# Patient Record
Sex: Male | Born: 1955 | Race: Black or African American | Hispanic: No | Smoking: Current every day smoker
Health system: Southern US, Community
[De-identification: ages and names within clinical notes are randomized; demographics above are authoritative.]

## PROBLEM LIST (undated history)

## (undated) DIAGNOSIS — E119 Type 2 diabetes mellitus without complications: Secondary | ICD-10-CM

## (undated) DIAGNOSIS — I10 Essential (primary) hypertension: Secondary | ICD-10-CM

---

## 1998-01-05 ENCOUNTER — Ambulatory Visit (HOSPITAL_COMMUNITY): Admission: RE | Admit: 1998-01-05 | Discharge: 1998-01-05 | Payer: Self-pay | Admitting: Family Medicine

## 2000-08-22 ENCOUNTER — Encounter: Payer: Self-pay | Admitting: Emergency Medicine

## 2000-08-22 ENCOUNTER — Inpatient Hospital Stay (HOSPITAL_COMMUNITY): Admission: EM | Admit: 2000-08-22 | Discharge: 2000-08-23 | Payer: Self-pay | Admitting: Internal Medicine

## 2001-04-23 ENCOUNTER — Emergency Department (HOSPITAL_COMMUNITY): Admission: EM | Admit: 2001-04-23 | Discharge: 2001-04-23 | Payer: Self-pay | Admitting: Emergency Medicine

## 2002-06-13 ENCOUNTER — Emergency Department (HOSPITAL_COMMUNITY): Admission: EM | Admit: 2002-06-13 | Discharge: 2002-06-14 | Payer: Self-pay | Admitting: Emergency Medicine

## 2004-04-02 ENCOUNTER — Emergency Department (HOSPITAL_COMMUNITY): Admission: EM | Admit: 2004-04-02 | Discharge: 2004-04-02 | Payer: Self-pay | Admitting: Emergency Medicine

## 2004-12-12 IMAGING — CR DG ELBOW COMPLETE 3+V*L*
3 series · 3 of 3 positions shown · non-contrast
Comparison: none

CLINICAL DATA: Left elbow pain. 
 LEFT ELBOW COMPLETE ([DATE] HOURS)

[view not recorded (1 of 3)]
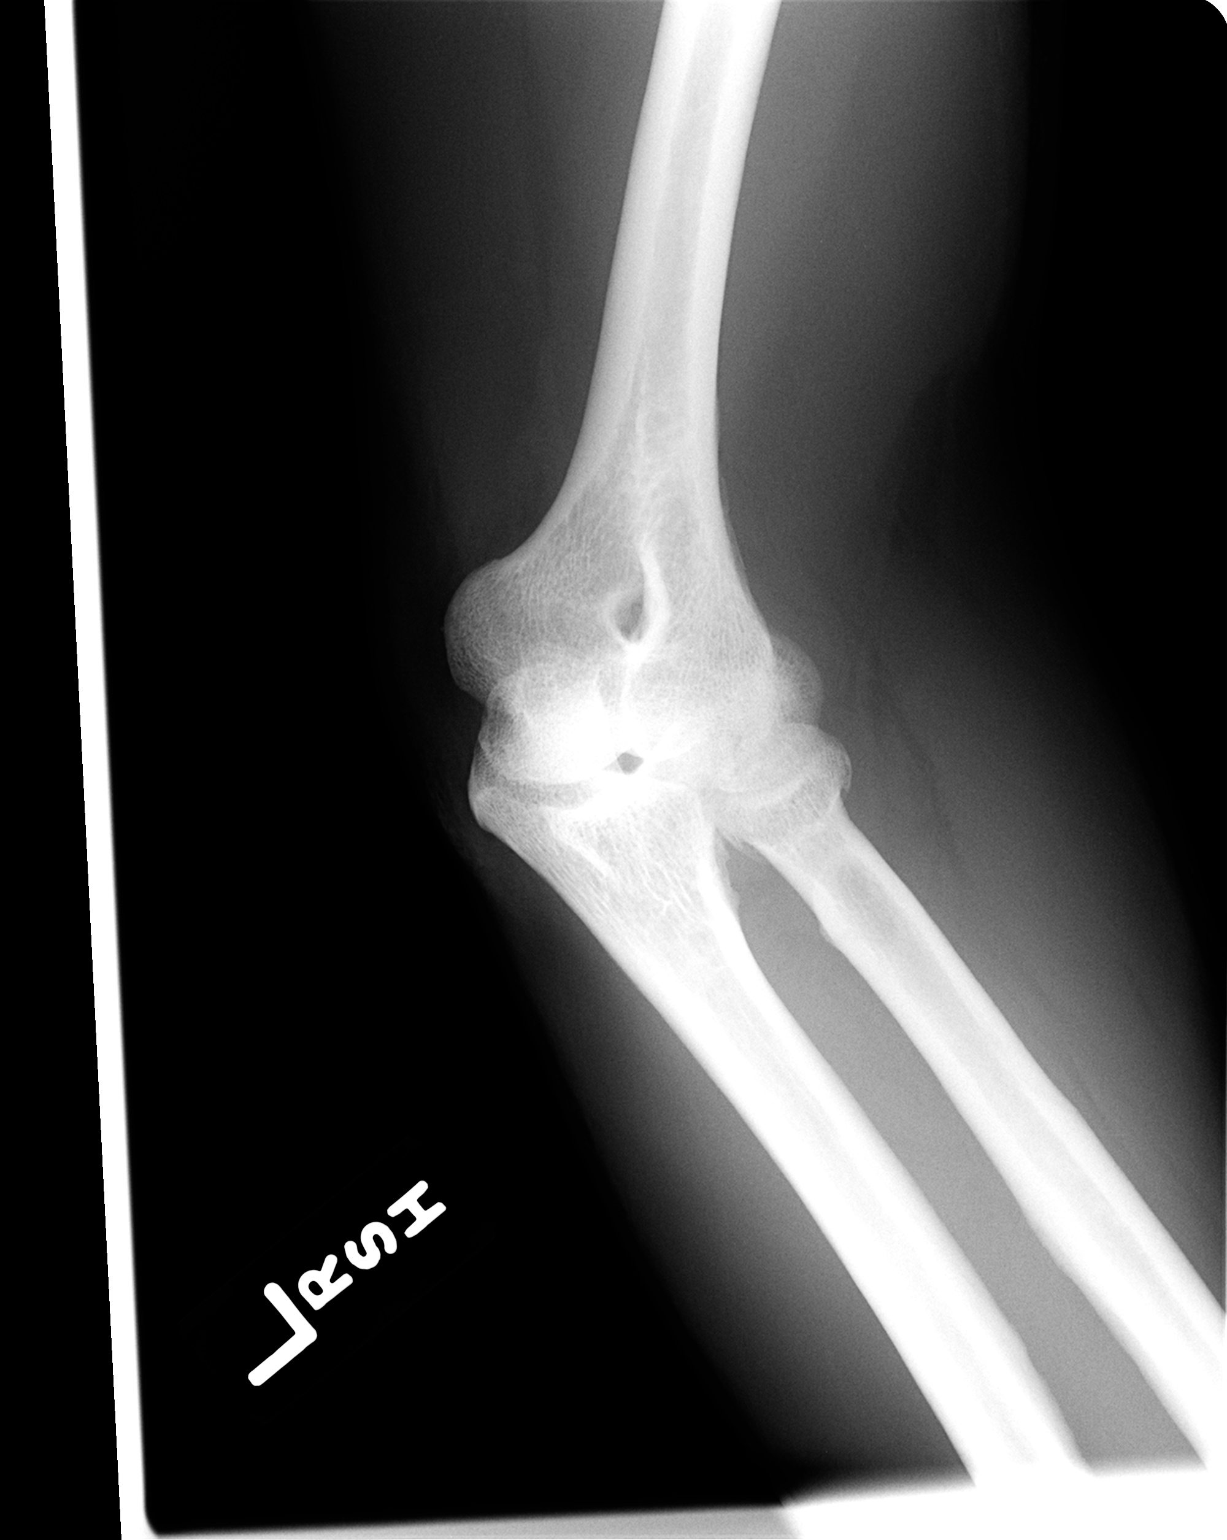

[view not recorded (2 of 3)]
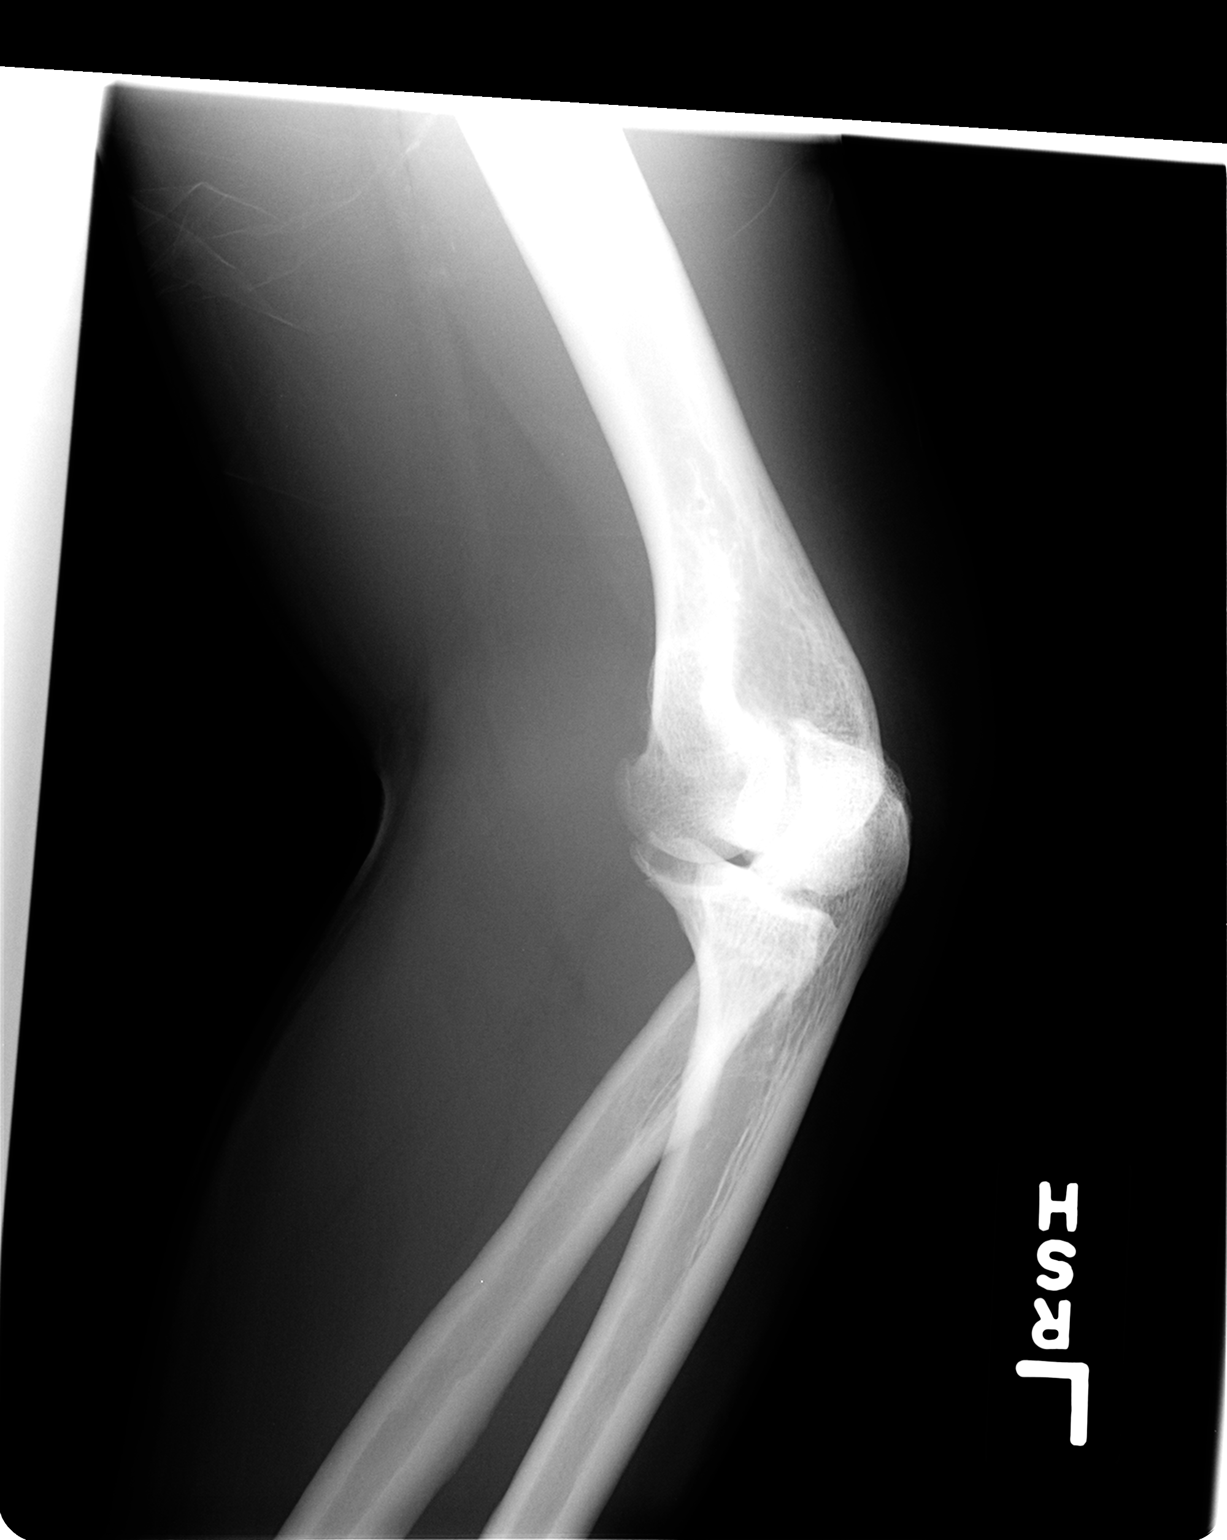

[view not recorded (3 of 3)]
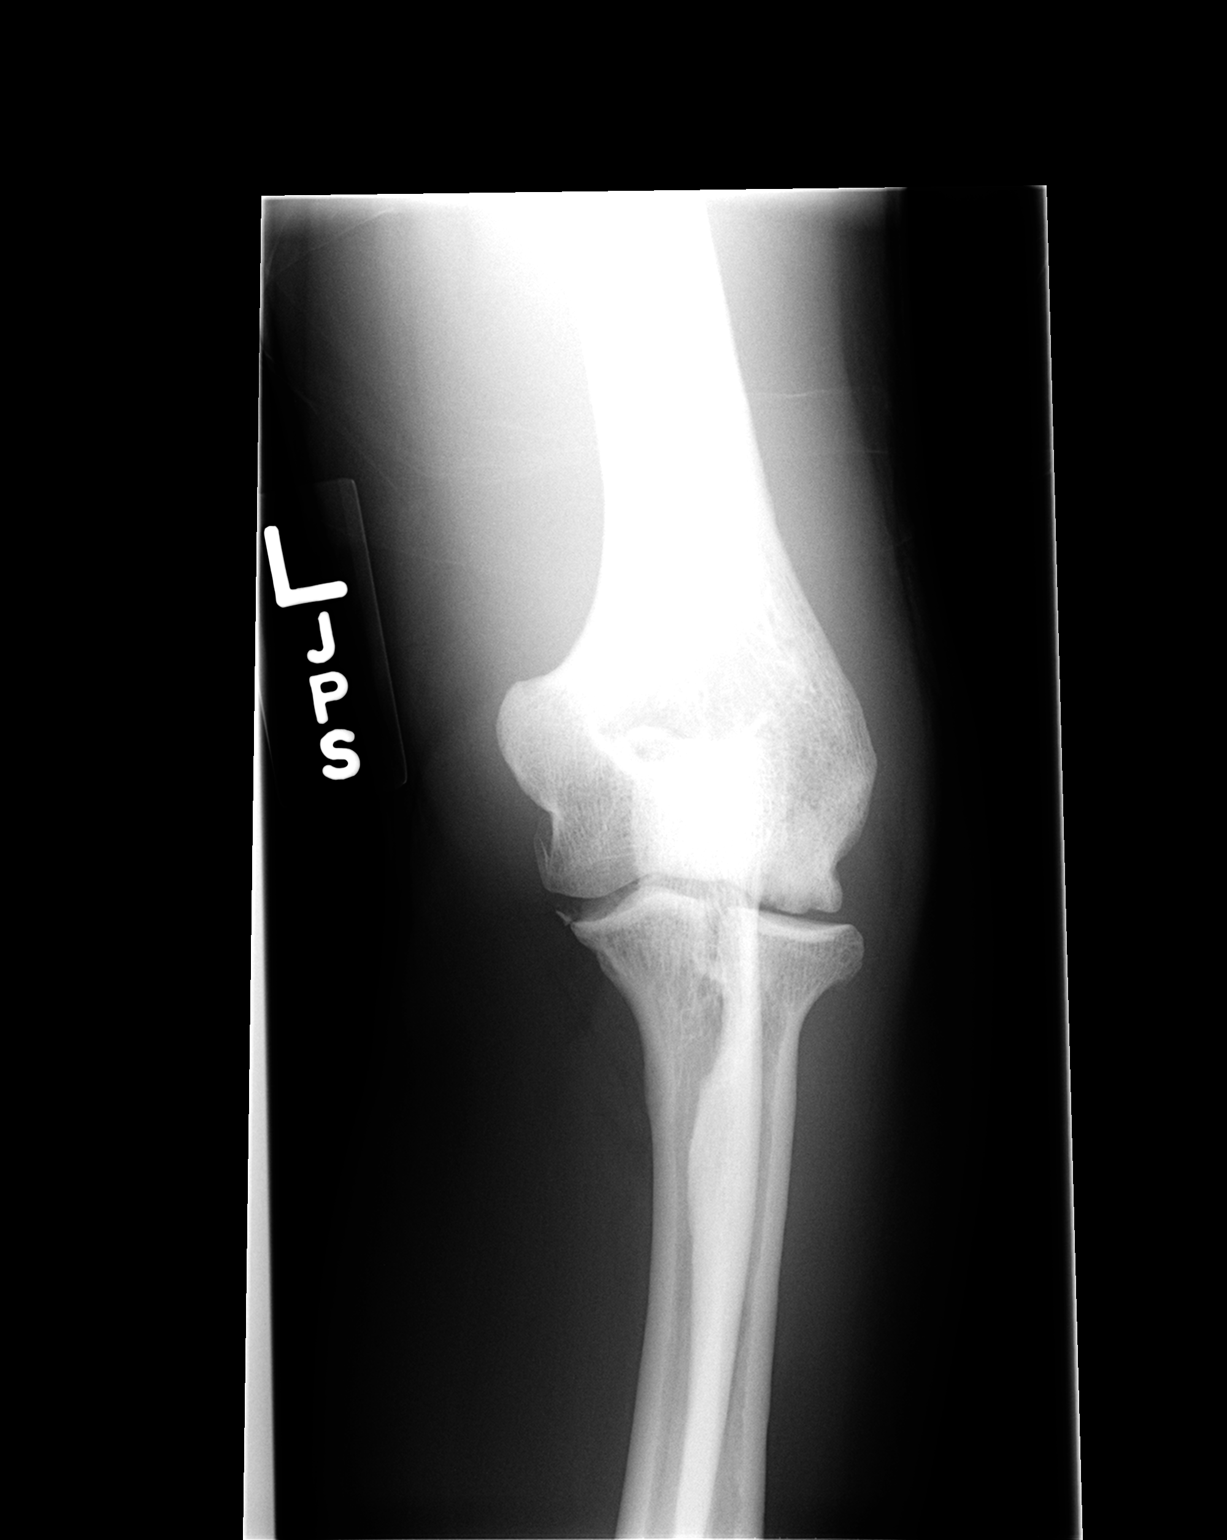

[3 of 3 positions shown; findings below may reference images not displayed]

FINDINGS: Osteophyte formation is noted about the proximal ulna and radial head.  Spurring is noted at the olecranon at the insertion of the triceps tendon.  A tiny bony density is seen on the AP view adjacent to the proximal ulna articular surface on the ulnar aspect and a tiny avulsion fracture may be present.  The capitellum articular surface is somewhat irregular and an osteochondral abnormality is not excluded.  A joint effusion is present characterized by prominence of the posterior fat pad.  On the lateral view, a 1 cm round bony density is seen anterior to the distal humerus.  It is not clearly visualized on the other views. 
 IMPRESSION
 1.  Joint effusion. 
 2.  Degenerative change. 
 3.  Tiny avulsion from the proximal ulna is suggested of indeterminate age. 
 4.  Round calcific density anterior to the distal humerus.  This may represent a bony fragment within the joint or possibly synovial osteochondromatosis. 
 5.  The articular surface of the capitellum is irregular and an osteochondral abnormality may be present.  further characterization with MRI can be performed as clinically indicated.

## 2009-03-21 ENCOUNTER — Emergency Department (HOSPITAL_COMMUNITY): Admission: EM | Admit: 2009-03-21 | Discharge: 2009-03-22 | Payer: Self-pay | Admitting: Emergency Medicine

## 2010-12-22 LAB — DIFFERENTIAL
Eosinophils Absolute: 0.1 10*3/uL (ref 0.0–0.7)
Eosinophils Relative: 1 % (ref 0–5)
Lymphocytes Relative: 34 % (ref 12–46)
Lymphs Abs: 4.7 10*3/uL — ABNORMAL HIGH (ref 0.7–4.0)
Monocytes Absolute: 1.3 10*3/uL — ABNORMAL HIGH (ref 0.1–1.0)
Monocytes Relative: 9 % (ref 3–12)

## 2010-12-22 LAB — BASIC METABOLIC PANEL
BUN: 15 mg/dL (ref 6–23)
Chloride: 108 mEq/L (ref 96–112)
GFR calc non Af Amer: >60 mL/min (ref >60)
Potassium: 3.8 mEq/L (ref 3.5–5.1)
Sodium: 138 mEq/L (ref 135–145)

## 2010-12-22 LAB — CBC
HCT: 50.5 % (ref 39.0–52.0)
Hemoglobin: 17.1 g/dL — ABNORMAL HIGH (ref 13.0–17.0)
MCV: 93.3 fL (ref 78.0–100.0)
Platelets: 210 10*3/uL (ref 150–400)
WBC: 13.6 10*3/uL — ABNORMAL HIGH (ref 4.0–10.5)

## 2010-12-22 LAB — GLUCOSE, CAPILLARY
Glucose-Capillary: 145 mg/dL — ABNORMAL HIGH (ref 70–99)
Glucose-Capillary: 227 mg/dL — ABNORMAL HIGH (ref 70–99)

## 2010-12-22 LAB — RAPID URINE DRUG SCREEN, HOSP PERFORMED
Cocaine: POSITIVE — AB
Opiates: NOT DETECTED

## 2010-12-22 LAB — ETHANOL: Alcohol, Ethyl (B): <5 mg/dL (ref 0–10)

## 2011-01-31 NOTE — Consult Note (Signed)
[place]  Patient:    Joel Hamilton, Joel Hamilton                          MRN: [id number] Proc. Date: 08/22/00 Adm. Date:  [id number] Attending:  Talmage Coin CC:         Dineen Kid. Reche Dixon, M.D.   Consultation Report  HISTORY OF PRESENT ILLNESS:  Mr. Fludd is a 55 year old black male who was admitted after having several hours of chest pain today.  The patient states that he has had a recent drinking binge and also smoking crack cocaine.  He has been lying in bed most of the day and noted some discomfort in his left pectoral area which he related to as gas.  He presented to the emergency room and was admitted for further evaluation.  The patient denies any history of IV drug use or any other drug use besides crack cocaine.  He does smoke cigarettes, approximately one pack-per-day.  He does Holiday representative work and does note similar left precordial vein when he is lifting something very heavy or pushing a heavy wheelbarrow.  In his usual every day activity has not noticed any chest pain.  He does not really know how long this has been going on.  He has a difficult time relating how long he has been using cocaine.  He as no other associated symptoms of nausea, vomiting, diaphoresis, or shortness of breath.  The patient apparently has a history of hypertension but has been on no medications.  He has no known history of diabetes, hypercholesterolemia, or family history of coronary disease.  PAST MEDICAL HISTORY:  Significant for depression and hypertension.  MEDICATIONS:  He is on no medications at present and has no known allergies.  PHYSICAL EXAMINATION:  GENERAL:  The patient is a young black male in no acute distress.  VITAL SIGNS:  Blood pressure is 160/90, pulse is 100, respirations 20.  He is afebrile.  HEENT:  Unremarkable except for dry mucous membranes.  He has no chest wall tenderness.  CARDIAC:  Without gallops, murmurs, rubs, or  clicks.  ABDOMEN:  Soft and nontender.  No masses or hepatosplenomegaly.  LUNGS:  Clear.  EXTREMITIES:  He has no edema.  He has no needle tracks.  ECG is normal.  Chest x-ray shows no active disease.  His CK was 879 with 7.0 MB.  Troponin was .05.  IMPRESSION: 1. Chest pain, atypical.  I suspect this is more muscular in origin.  Probably    related to his recent substance abuse and cocaine abuse. 2. Cocaine and alcohol abuse. 3. Tobacco abuse. 4. Hypertension.  PLAN:  It would make sense to get serial cardiac enzymes.  However, I think it is unlikely that this represents a coronary event.  His biggest risk is ongoing cocaine abuse, and he needs to be counseled on stopping this.  If the attending physician feels strongly, we could certainly arrange for the patient to have a stress test to further ______  him, but his biggest risk will be how he manages his substance abuse.  Will await the result of subsequent cardiac enzymes. DD:  08/22/00 TD:  08/23/00 Job: [id number]

## 2017-09-18 ENCOUNTER — Encounter: Payer: Self-pay | Admitting: Pediatric Intensive Care

## 2017-09-18 DIAGNOSIS — E118 Type 2 diabetes mellitus with unspecified complications: Secondary | ICD-10-CM

## 2017-09-18 DIAGNOSIS — Z794 Long term (current) use of insulin: Principal | ICD-10-CM

## 2017-09-18 LAB — GLUCOSE, POCT (MANUAL RESULT ENTRY): POC Glucose: 141 mg/dl — AB (ref 70–99)

## 2017-09-22 ENCOUNTER — Encounter (HOSPITAL_COMMUNITY): Payer: Self-pay | Admitting: Emergency Medicine

## 2017-09-22 ENCOUNTER — Emergency Department (HOSPITAL_COMMUNITY)
Admission: EM | Admit: 2017-09-22 | Discharge: 2017-09-23 | Disposition: A | Discharge status: Home / Self Care | Payer: Self-pay | Attending: Emergency Medicine | Admitting: Emergency Medicine

## 2017-09-22 DIAGNOSIS — F1410 Cocaine abuse, uncomplicated: Secondary | ICD-10-CM | POA: Insufficient documentation

## 2017-09-22 DIAGNOSIS — F1994 Other psychoactive substance use, unspecified with psychoactive substance-induced mood disorder: Secondary | ICD-10-CM | POA: Insufficient documentation

## 2017-09-22 DIAGNOSIS — E119 Type 2 diabetes mellitus without complications: Secondary | ICD-10-CM | POA: Insufficient documentation

## 2017-09-22 DIAGNOSIS — F332 Major depressive disorder, recurrent severe without psychotic features: Secondary | ICD-10-CM | POA: Insufficient documentation

## 2017-09-22 DIAGNOSIS — F1010 Alcohol abuse, uncomplicated: Secondary | ICD-10-CM | POA: Insufficient documentation

## 2017-09-22 DIAGNOSIS — I10 Essential (primary) hypertension: Secondary | ICD-10-CM | POA: Insufficient documentation

## 2017-09-22 DIAGNOSIS — F17210 Nicotine dependence, cigarettes, uncomplicated: Secondary | ICD-10-CM | POA: Insufficient documentation

## 2017-09-22 DIAGNOSIS — R45851 Suicidal ideations: Secondary | ICD-10-CM | POA: Insufficient documentation

## 2017-09-22 HISTORY — DX: Type 2 diabetes mellitus without complications: E11.9

## 2017-09-22 HISTORY — DX: Essential (primary) hypertension: I10

## 2017-09-22 LAB — RAPID URINE DRUG SCREEN, HOSP PERFORMED
Amphetamines: NOT DETECTED
BARBITURATES: NOT DETECTED
Benzodiazepines: NOT DETECTED
Cocaine: POSITIVE — AB
Opiates: NOT DETECTED
TETRAHYDROCANNABINOL: NOT DETECTED

## 2017-09-22 LAB — COMPREHENSIVE METABOLIC PANEL
ALBUMIN: 3.4 g/dL — AB (ref 3.5–5.0)
ALK PHOS: 107 U/L (ref 38–126)
ALT: 191 U/L — AB (ref 17–63)
AST: 118 U/L — ABNORMAL HIGH (ref 15–41)
Anion gap: 7 (ref 5–15)
BILIRUBIN TOTAL: 1.1 mg/dL (ref 0.3–1.2)
BUN: 25 mg/dL — ABNORMAL HIGH (ref 6–20)
CALCIUM: 9.3 mg/dL (ref 8.9–10.3)
CO2: 24 mmol/L (ref 22–32)
CREATININE: 1.18 mg/dL (ref 0.61–1.24)
Chloride: 107 mmol/L (ref 101–111)
GFR calc non Af Amer: >60 mL/min (ref >60)
GLUCOSE: 145 mg/dL — AB (ref 65–99)
Potassium: 3.7 mmol/L (ref 3.5–5.1)
SODIUM: 138 mmol/L (ref 135–145)
TOTAL PROTEIN: 8.2 g/dL — AB (ref 6.5–8.1)

## 2017-09-22 LAB — URINALYSIS, ROUTINE W REFLEX MICROSCOPIC
Bacteria, UA: NONE SEEN
Bilirubin Urine: NEGATIVE
GLUCOSE, UA: NEGATIVE mg/dL
Ketones, ur: NEGATIVE mg/dL
LEUKOCYTES UA: NEGATIVE
NITRITE: NEGATIVE
PH: 5 (ref 5.0–8.0)
Protein, ur: 100 mg/dL — AB
Specific Gravity, Urine: 1.015 (ref 1.005–1.030)

## 2017-09-22 LAB — ACETAMINOPHEN LEVEL: Acetaminophen (Tylenol), Serum: <10 ug/mL — ABNORMAL LOW (ref 10–30)

## 2017-09-22 LAB — CBC
HEMATOCRIT: 43.8 % (ref 39.0–52.0)
HEMOGLOBIN: 15.2 g/dL (ref 13.0–17.0)
MCH: 31.1 pg (ref 26.0–34.0)
MCHC: 34.7 g/dL (ref 30.0–36.0)
MCV: 89.6 fL (ref 78.0–100.0)
Platelets: 220 10*3/uL (ref 150–400)
RBC: 4.89 MIL/uL (ref 4.22–5.81)
RDW: 13.4 % (ref 11.5–15.5)
WBC: 13.9 10*3/uL — ABNORMAL HIGH (ref 4.0–10.5)

## 2017-09-22 LAB — ETHANOL: Alcohol, Ethyl (B): <10 mg/dL (ref <10)

## 2017-09-22 LAB — SALICYLATE LEVEL: Salicylate Lvl: <7 mg/dL (ref 2.8–30.0)

## 2017-09-22 MED ORDER — VITAMIN B-1 100 MG PO TABS
100.0000 mg | ORAL_TABLET | Freq: Every day | ORAL | Status: DC
  Administered 2017-09-22 – 2017-09-23 (×2): 100 mg via ORAL
  Filled 2017-09-22 (×2): qty 1

## 2017-09-22 MED ORDER — LORAZEPAM 1 MG PO TABS: 0.0000 mg | ORAL_TABLET | Freq: Four times a day (QID) | ORAL | Status: DC

## 2017-09-22 MED ORDER — CLONIDINE HCL 0.1 MG PO TABS
0.1000 mg | ORAL_TABLET | Freq: Once | ORAL | Status: AC
  Administered 2017-09-23: 0.1 mg via ORAL
  Filled 2017-09-22: qty 1

## 2017-09-22 MED ORDER — ACETAMINOPHEN 325 MG PO TABS: 650.0000 mg | ORAL_TABLET | ORAL | Status: DC | PRN

## 2017-09-22 MED ORDER — GABAPENTIN 300 MG PO CAPS
600.0000 mg | ORAL_CAPSULE | Freq: Three times a day (TID) | ORAL | Status: DC
  Administered 2017-09-22 – 2017-09-23 (×2): 600 mg via ORAL
  Filled 2017-09-22 (×2): qty 2

## 2017-09-22 MED ORDER — LORAZEPAM 2 MG/ML IJ SOLN: 0.0000 mg | Freq: Four times a day (QID) | INTRAMUSCULAR | Status: DC

## 2017-09-22 MED ORDER — THIAMINE HCL 100 MG/ML IJ SOLN: 100.0000 mg | Freq: Every day | INTRAMUSCULAR | Status: DC

## 2017-09-22 MED ORDER — LORAZEPAM 2 MG/ML IJ SOLN: 0.0000 mg | Freq: Two times a day (BID) | INTRAMUSCULAR | Status: DC

## 2017-09-22 MED ORDER — LORAZEPAM 1 MG PO TABS: 0.0000 mg | ORAL_TABLET | Freq: Two times a day (BID) | ORAL | Status: DC

## 2017-09-22 NOTE — ED Notes (Signed)
[facility] Expected date: 09/22/17 Expected time:  Means of arrival:  Comments: Hold RN has 2 level 1

## 2017-09-22 NOTE — ED Notes (Signed)
ED Provider at bedside. 

## 2017-09-22 NOTE — ED Triage Notes (Signed)
Patient brought in by [employer] after requesting to come here due to Suicidal and intoxicated. patient also been using cocaine.

## 2017-09-22 NOTE — ED Provider Notes (Signed)
[facility]-EMERGENCY DEPT Provider Note   CSN: [id number] Arrival date & time: 09/22/17  1552     History   Chief Complaint Chief Complaint  Patient presents with  . Alcohol Intoxication  . Suicidal    HPI Joel Hamilton is a 62 y.o. male.  HPI Patient reports that he is sick of living because he just can never seem to quit using drugs.  He uses cocaine.  Patient reports he also drinks alcohol.  He does not really specify when his last drink was.  He states he is thought of a lot of different ways to kill himself.  He feels hopeless and worthless.  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   Past Medical History:  Diagnosis Date  . Diabetes mellitus without complication (HCC)   . Hypertension     There are no active problems to display for this patient.   History reviewed. No pertinent surgical history.     Home Medications    Prior to Admission medications   Medication Sig Start Date End Date Taking? Authorizing Provider  gabapentin (NEURONTIN) 600 MG tablet Take 600 mg by mouth 3 (three) times daily.   Yes [provider]    Family History No family history on file.  Social History Social History   Tobacco Use  . Smoking status: Current Every Day Smoker    Types: Cigarettes  . Smokeless tobacco: Never Used  Substance Use Topics  . Alcohol use: Yes  . Drug use: Not on file     Allergies   Patient has no known allergies.   Review of Systems Review of Systems  10 Systems reviewed and are negative for acute change except as noted in the HPI.  Physical Exam Updated Vital Signs BP (!) 160/108 (BP Location: Left Arm)   Pulse (!) 112   Temp 98.7 F (37.1 C) (Oral)   Resp 17   SpO2 95%   Physical Exam  Constitutional: He is oriented to person, place, and time.  Patient rests quietly in the stretcher.  When awakened he  is oriented and appropriate. Does not Show any signs of agitation or anxiety.  No respiratory distress.  HENT:  Head: Normocephalic and atraumatic.  Patient has very poor dentition with gingivitis.  Eyes:  Right eye is diffusely injected and pupil is irregular in appearance.  (Patient reports that this is chronic and that he has no vision in this eye).  Neck: Neck supple.  Cardiovascular: Normal rate, regular rhythm, normal heart sounds and intact distal pulses.  Pulmonary/Chest: Effort normal and breath sounds normal.  Abdominal: Soft. Bowel sounds are normal. He exhibits no distension. There is no tenderness. There is no guarding.  Musculoskeletal: Normal range of motion.  Patient ambulates about the emergency department back and forth to the bathroom without difficulty.  1+ pitting edema bilateral lower extremities.  Neurological: He is alert and oriented to person, place, and time. No cranial nerve deficit. He exhibits normal muscle tone. Coordination normal.  Skin: Skin is warm and dry.  Psychiatric:  Affect is flat.     ED Treatments / Results  Labs (all labs ordered are listed, but only abnormal results are displayed) Labs Reviewed  COMPREHENSIVE METABOLIC PANEL - Abnormal; Notable for the following components:      Result Value   Glucose, Bld 145 (*)  BUN 25 (*)    Total Protein 8.2 (*)    Albumin 3.4 (*)    AST 118 (*)    ALT 191 (*)    All other components within normal limits  ACETAMINOPHEN LEVEL - Abnormal; Notable for the following components:   Acetaminophen (Tylenol), Serum <10 (*)    All other components within normal limits  CBC - Abnormal; Notable for the following components:   WBC 13.9 (*)    All other components within normal limits  RAPID URINE DRUG SCREEN, HOSP PERFORMED - Abnormal; Notable for the following components:   Cocaine POSITIVE (*)    All other components within normal limits  URINALYSIS, ROUTINE W REFLEX MICROSCOPIC - Abnormal; Notable for  the following components:   Hgb urine dipstick MODERATE (*)    Protein, ur 100 (*)    Squamous Epithelial / LPF 0-5 (*)    All other components within normal limits  ETHANOL  SALICYLATE LEVEL    EKG  EKG Interpretation None       Radiology No results found.  Procedures Procedures (including critical care time)  Medications Ordered in ED Medications  LORazepam (ATIVAN) injection 0-4 mg (not administered)    Or  LORazepam (ATIVAN) tablet 0-4 mg (not administered)  LORazepam (ATIVAN) injection 0-4 mg (not administered)    Or  LORazepam (ATIVAN) tablet 0-4 mg (not administered)  thiamine (VITAMIN B-1) tablet 100 mg (not administered)    Or  thiamine (B-1) injection 100 mg (not administered)  acetaminophen (TYLENOL) tablet 650 mg (not administered)     Initial Impression / Assessment and Plan / ED Course  I have reviewed the triage vital signs and the nursing notes.  Pertinent labs & imaging results that were available during my care of the patient were reviewed by me and considered in my medical decision making (see chart for details).      Final Clinical Impressions(s) / ED Diagnoses   Final diagnoses:  Cocaine abuse (HCC)  ETOH abuse  Severe episode of recurrent major depressive disorder, without psychotic features (HCC)  Suicidal ideation  Patient is medically cleared for psychiatric evaluation.  He reports alcohol abuse but alcohol is negative.  He shows no signs of alcohol withdrawal.  Patient does admit to cocaine abuse as being part of the main reason he feels suicidal.  Patient did test positive for cocaine.  Patient has history of diet-controlled diabetes.  Blood sugars are in the 140s.  No signs of uncontrolled diabetes although should have better baseline medical management.  Diet control is appropriate at this time.  Patient has had chronically elevated white blood cell count at 13.9.  At this time do not identify signs of any acute infectious  illness.  ED Discharge Orders    None       Arby Barrette, MD 09/22/17 2125

## 2017-09-22 NOTE — ED Notes (Signed)
Patient in purple scrubs, wonded by security, belongings placed in cabinet.

## 2017-09-23 DIAGNOSIS — F1994 Other psychoactive substance use, unspecified with psychoactive substance-induced mood disorder: Secondary | ICD-10-CM | POA: Diagnosis present

## 2017-09-23 MED ORDER — LISINOPRIL 5 MG PO TABS
5.0000 mg | ORAL_TABLET | Freq: Every day | ORAL | Status: DC
  Administered 2017-09-23: 5 mg via ORAL
  Filled 2017-09-23: qty 1

## 2017-09-23 MED ORDER — CLONIDINE HCL 0.1 MG PO TABS
0.1000 mg | ORAL_TABLET | Freq: Three times a day (TID) | ORAL | Status: DC | PRN
  Administered 2017-09-23: 0.1 mg via ORAL
  Filled 2017-09-23: qty 1

## 2017-09-23 NOTE — ED Notes (Signed)
Pt d/c home per MD order. Discharge summary reviewed with pt. Pt uninterested. Pt not endorsing SI/HI/AVH. Personal property returned. Pt signed e-signature. Ambulatory off unit with [facility].

## 2017-09-23 NOTE — ED Notes (Signed)
Patient reports SI and denies HI and AVH at this time. Plan of care discussed. Encouragement and support provided and safety maintain. Q 15 min safety checks in place and video monitoring.

## 2017-09-23 NOTE — BH Assessment (Signed)
[facility]  Per Juanetta BeetsJacqueline Norman, DO, this pt does not require psychiatric hospitalization at this time.  [facility]  Pt would also benefit from seeing Peer Support Specialists; they will be asked to speak to pt.  No further discharge instructions are needed.  Pt's nurse, Morrie Sheldonshley, has been notified.  Doylene Canninghomas Lynore Coscia, MA Triage Specialist [phone]

## 2017-09-23 NOTE — Patient Outreach (Signed)
ED Peer Support Specialist Patient Intake (Complete at intake & 30-60 Day Follow-up)  Name: Joel Hamilton  MRN: [id number]  Age: 62 y.o.   Date of Admission: 09/23/2017  Intake: Initial Comments:      Primary Reason Admitted: ?" Pt "I relapsed yesterday." Pt reported, using alcohol and crack cocaine, a little over 30 days ago."  Clinician asked the pt how much alcohol and crack cocaine he use. Pt replied, "I don't know." Pt reported, he has been suicidal since he relapsed. Pt denied a specific plan. Pt reported, access to knives and guns. Pt denies, HI, AVH, and self-injurious behaviors.   Pt denies abuse. Pt reported, using alcohol and crack cocaine, yesterday. Pt's UDS is positive for cocaine. Pt denies, being linked to OPT resources (medication management and/or counseling.) Pt reported a previous inpatient admission at the [facility] in [place] in 1994.     Lab values: Alcohol/ETOH: Positive Positive UDS? No Amphetamines: No Barbiturates: No Benzodiazepines: No Cocaine: Yes Opiates: No Cannabinoids: No  Demographic information: Gender: Male Ethnicity: African American Marital Status: Widowed Insurance Status: Uninsured/Self-pay Ecologist (Work Neurosurgeon, Physicist, medical, etc.: No Lives with: Alone Living situation:    Reported Patient History: Patient reported health conditions: Hypertension, Diabetes Patient aware of HIV and hepatitis status: Yes (comment)(Hep C )  In past year, has patient visited ED for any reason? Yes  Number of ED visits:    Reason(s) for visit: Diabetes and alcohol and substance   In past year, has patient been hospitalized for any reason? No  Number of hospitalizations:    Reason(s) for hospitalization:    In past year, has patient been arrested? No  Number of arrests:    Reason(s) for arrest:    In past year, has patient been incarcerated? No  Number of incarcerations:    Reason(s) for incarceration:    In  past year, has patient received medication-assisted treatment? Yes, [facility]io[facility] Treatment Programs (OPT)  In past year, patient received the following treatments: Residential treatment (non-hospital), Non-residential community treatment  In past year, has patient received any harm reduction services? No  Did this include any of the following?    In past year, has patient received care from a mental health provider for diagnosis other than SUD? No  In past year, is this first time patient has overdosed? No  Number of past overdoses:    In past year, is this first time patient has been hospitalized for an overdose? No  Number of hospitalizations for overdose(s):    Is patient currently receiving treatment for a mental health diagnosis? No  Patient reports experiencing difficulty participating in SUD treatment: No    Most important reason(s) for this difficulty?    Has patient received prior services for treatment? No  In past, patient has received services from following agencies:    Plan of Care:  Suggested follow up at these agencies/treatment centers: [facility]), [facility]  Other information: CPSS Joel Hamilton and Joel Hamilton met with Pt and was able to process with Pt to see what services Pt was seeking. CPSS  Discussed several options that maybe beneficial for Pt to look into. CPSS was made aware that Pt had left [facility] a few days ago and was unable to return. CPSS Joel Hamilton presented several NA and AA options that Pt can attend as well as contact information for Pt to stay in contact while in the community.      Joel Hamilton Joel Hamilton, CPSS  09/23/2017  11:26 AM

## 2017-09-23 NOTE — BHH Suicide Risk Assessment (Signed)
Suicide Risk Assessment  Discharge Assessment   [facility]   Principal Problem: <principal problem not specified> Discharge Diagnoses: There are no active problems to display for this patient.   Total Time spent with patient: 30 minutes  Musculoskeletal: Strength & Muscle Tone: within normal limits Gait & Station: normal Patient leans: N/A  Psychiatric Specialty Exam:   Blood pressure (!) 152/105, pulse 86, temperature 98.3 F (36.8 C), resp. rate 20, SpO2 99 %.There is no height or weight on file to calculate BMI.  General Appearance: Casual  Eye Contact::  Poor  Speech:  Clear and Coherent409  Volume:  Decreased  Mood:  Depressed  Affect:  Congruent and Depressed  Thought Process:  Coherent and Goal Directed  Orientation:  Full (Time, Place, and Person)  Thought Content:  Logical  Suicidal Thoughts:  No  Homicidal Thoughts:  No  Memory:  Immediate;   Good Recent;   Fair Remote;   Fair  Judgement:  Fair  Insight:  Fair  Psychomotor Activity:  Normal  Concentration:  Good  Recall:  Good  Fund of Knowledge:Good  Language: Good  Akathisia:  No  Handed:  Right  AIMS (if indicated):     Assets:  Communication Skills Social Support  Sleep:     Cognition: WNL  ADL's:  Intact   Mental Status Per Nursing Assessment::   On Admission:   suicidal ideation while "high on cocaine"  Demographic Factors:  Male and Low socioeconomic status  Loss Factors: Financial problems/change in socioeconomic status  Historical Factors: Impulsivity  Risk Reduction Factors:   Sense of responsibility to family  Continued Clinical Symptoms:  Alcohol/Substance Abuse/Dependencies  Cognitive Features That Contribute To Risk:  Closed-mindedness    Suicide Risk:  Minimal: No identifiable suicidal ideation.  Patients presenting with no risk factors but with morbid ruminations; may be classified as minimal risk based on the severity of the depressive  symptoms    Plan Of Care/Follow-up recommendations:  Activity:  as tolerated Diet:  Heart Healthy  Laveda AbbeLaurie Britton Parks, NP 09/23/2017, 11:06 AM

## 2017-09-23 NOTE — BH Assessment (Addendum)
Assessment Note  Joel Hamilton is an 62 y.o. male, who [facility]. Clinician asked the pt, "what brought you to the hospital?" Pt "I relapsed yesterday." Pt reported, using alcohol and crack cocaine, a little over 30 days ago."  Clinician asked the pt how much alcohol and crack cocaine he use. Pt replied, "I don't know." Pt reported, he has been suicidal since he relapsed. Pt denied a specific plan. Pt reported, access to knives and guns. Pt denies, HI, AVH, and self-injurious behaviors.   Pt denies abuse. Pt reported, using alcohol and crack cocaine, yesterday. Pt's UDS is positive for cocaine. Pt denies, being linked to OPT resources (medication management and/or counseling.) Pt reported a previous inpatient [facility] [place] in [facility].   Pt presents sleeping in scrubs with soft, logical/coherent speech. Pt's eye contact was poor (pt had eyes closed). Pt's mood was sad, helpless. Pt's affect was flat. Pt's thought process was coherent/relevant. Pt's concentration was fair. Pt's insight and impulse control are poor. Pt was oriented x4. Clinician [facility] the [facility] could he contract for safety. Pt replied, "I don't know."  Pt reported, id inpatient treatment is recommended he will sign-in voluntarily.   Diagnosis: F33.2 Major Depressive Disorder, Recurrent, Severe, without Psychotic Features.                    F10.20 Alcohol use disorder, Moderate.                    F14.20 Cocaine use Disorder, Moderate.  Past Medical History:  Past Medical History:  Diagnosis Date  . Diabetes mellitus without complication (HCC)   . Hypertension     History reviewed. No pertinent surgical history.  Family History: No family history on file.  Social History:  reports that he has been smoking cigarettes.  he has never used smokeless tobacco. He reports that he drinks alcohol. His drug history is not on file.  Additional Social History:  Alcohol  / Drug Use Pain Medications: See MAR Prescriptions: See MAR Over the Counter: See MAR History of alcohol / drug use?: Yes Substance #1 Name of Substance 1: Alcohol.  1 - Age of First Use: UTA 1 - Amount (size/oz): Pt reported, I don't remember."  1 - Frequency: UTA 1 - Duration: UTA 1 - Last Use / Amount: Pt reported, yesterday.  Substance #3 Name of Substance 3: Cocaine.  3 - Age of First Use: UTA 3 - Amount (size/oz): Pt reported, "I don't rememeber."  3 - Frequency: UTA 3 - Duration: UTA 3 - Last Use / Amount: Pt reported, yesterday.   CIWA: CIWA-Ar BP: (!) 144/98 Pulse Rate: 99 Nausea and Vomiting: no nausea and no vomiting Tactile Disturbances: none Tremor: no tremor Auditory Disturbances: very mild harshness or ability to frighten Paroxysmal Sweats: no sweat visible Visual Disturbances: mild sensitivity Anxiety: mildly anxious Headache, Fullness in Head: none present Agitation: normal activity Orientation and Clouding of Sensorium: oriented and can do serial additions CIWA-Ar Total: 4 COWS:    Allergies: No Known Allergies  Home Medications:  (Not in a hospital admission)  OB/GYN Status:  No LMP for male patient.  General Assessment Data [facility] of [facility]: [facility] T[facility] Assessment: In system Is this a Tele or Face-to-Face Assessment?: Face-to-Face Is this an Initial Assessment or a Re-assessment for this encounter?: Initial Assessment Marital status: Widowed Is patient pregnant?: No Pregnancy Status: No Living Arrangements: Spouse/significant other Can  pt return to current living arrangement?: Yes Admission Status: Voluntary Is patient capable of signing voluntary admission?: Yes Referral Source: Self/Family/Friend Insurance type: Self-pay.     Crisis Care Plan Living Arrangements: Spouse/significant other Legal Guardian: Other:(Self.) Name of Psychiatrist: NA Name of Therapist: NA  Education Status Is patient currently in school?: No Current  Grade: NA Highest grade of school patient has completed: 12th grade.  Name of school: NA Contact person: NA  Risk to self with the past 6 months Suicidal Ideation: Yes-Currently Present Has patient been a risk to self within the past 6 months prior to admission? : Yes Suicidal Intent: No Has patient had any suicidal intent within the past 6 months prior to admission? : No Is patient at risk for suicide?: Yes Suicidal Plan?: No(Pt denies. ) Has patient had any suicidal plan within the past 6 months prior to admission? : No Access to Means: No What has been your use of drugs/alcohol within the last 12 months?: Alcohol and cocaine.  Previous Attempts/Gestures: No How many times?: 0 Other Self Harm Risks: Pt denies.  Triggers for Past Attempts: None known Intentional Self Injurious Behavior: None(Pt denies. ) Family Suicide History: No Recent stressful life event(s): Other (Comment)(Relasped on alcohol and cocaine. ) Persecutory voices/beliefs?: No Depression: Yes Depression Symptoms: Feeling angry/irritable, Feeling worthless/self pity, Loss of interest in usual pleasures, Guilt, Fatigue, Isolating Substance abuse history and/or treatment for substance abuse?: Yes Suicide prevention information given to non-admitted patients: Not applicable  Risk to Others within the past 6 months Homicidal Ideation: No(Pt denies. ) Does patient have any lifetime risk of violence toward others beyond the six months prior to admission? : No Thoughts of Harm to Others: No Current Homicidal Intent: No Current Homicidal Plan: No Access to Homicidal Means: No Identified Victim: NA History of harm to others?: No(Pt denies. ) Assessment of Violence: None Noted Does patient have access to weapons?: Yes (Comment)(Guns and knives. ) Criminal Charges Pending?: No Does patient have a court date: No Is patient on probation?: No  Psychosis Hallucinations: None noted Delusions: None noted  Mental Status  Report Appearance/Hygiene: In scrubs Eye Contact: Poor Motor Activity: Unremarkable Speech: Logical/coherent, Soft Level of Consciousness: Sleeping Affect: Flat Anxiety Level: Minimal Thought Processes: Coherent, Relevant Judgement: Partial Orientation: Person, Place, Situation, Time Obsessive Compulsive Thoughts/Behaviors: None  Cognitive Functioning Concentration: Fair Memory: Recent Intact IQ: Average Insight: Poor Impulse Control: Poor Appetite: Fair Sleep: No Change Total Hours of Sleep: (Pt reported, 6 or 8 hours. ) Vegetative Symptoms: Staying in bed, Not bathing, Decreased grooming  ADLScreening [facility] Assessment Services) Patient's cognitive ability adequate to safely complete daily activities?: Yes Patient able to express need for assistance with ADLs?: Yes Independently performs ADLs?: Yes (appropriate for developmental age)  Prior Inpatient Therapy Prior Inpatient Therapy: Yes Prior Therapy Dates: 1994 Prior Therapy Facilty/Provider(s): Care Unit,  [place].  Reason for Treatment: Substance use.   Prior Outpatient Therapy Prior Outpatient Therapy: No Prior Therapy Dates: NA Prior Therapy Facilty/Provider(s): NA Reason for Treatment: NA Does patient have an ACCT team?: No Does patient have Intensive In-House Services?  : No Does patient have [facility] services? : No Does patient have P4CC services?: No  ADL Screening (condition at time of admission) Patient's cognitive ability adequate to safely complete daily activities?: Yes Is the patient deaf or have difficulty hearing?: No Does the patient have difficulty seeing, even when wearing glasses/contacts?: Yes Does the patient have difficulty concentrating, remembering, or making decisions?: Yes Patient able to express  need for assistance with ADLs?: Yes Does the patient have difficulty dressing or bathing?: No Independently performs ADLs?: Yes (appropriate for developmental age) Does the patient have  difficulty walking or climbing stairs?: No Weakness of Legs: None Weakness of Arms/Hands: None  Home Assistive Devices/Equipment Home Assistive Devices/Equipment: None    Abuse/Neglect Assessment (Assessment to be complete while patient is alone) Abuse/Neglect Assessment Can Be Completed: Yes Physical Abuse: Denies(Pt denies.) Verbal Abuse: Denies(Pt denies. ) Sexual Abuse: Denies(Pt denies. ) Exploitation of patient/patient's resources: Denies(Pt denies. ) Self-Neglect: Denies(Pt denies. )     Advance Directives (For Healthcare) Does Patient Have a Medical Advance Directive?: (UTA)    Additional Information 1:1 In Past 12 Months?: No CIRT Risk: No Elopement Risk: No Does patient have medical clearance?: No(Pt's blood pressure is elevated. )     Disposition: Donell Sievert, PA recommends overnight observation for safety and stabilization. Disposition discussed with Dr. Judd Lien and Camelia Eng, Charge Nurse.    Disposition Initial Assessment Completed for this Encounter: Yes Disposition of Patient: Re-evaluation by Psychiatry recommended  On Site Evaluation by:  Holly Bodily. Song Garris, MS, LPC, CRC. Reviewed with Physician:  Dr. Judd Lien and Donell Sievert, PA.  Redmond Pulling 09/23/2017 2:24 AM   Redmond Pulling, MS, LPC, CRC Triage Specialist [phone]

## 2017-09-29 ENCOUNTER — Encounter: Payer: Self-pay | Admitting: Pediatric Intensive Care

## 2017-09-30 ENCOUNTER — Encounter: Payer: Self-pay | Admitting: Pediatric Intensive Care

## 2017-09-30 DIAGNOSIS — E118 Type 2 diabetes mellitus with unspecified complications: Secondary | ICD-10-CM

## 2017-09-30 LAB — GLUCOSE, POCT (MANUAL RESULT ENTRY): POC GLUCOSE: 341 mg/dL — AB (ref 70–99)

## 2017-09-30 NOTE — Congregational Nurse Program (Signed)
Congregational Nurse Program Note  Date of Encounter: 09/30/2017  Past Medical History: Past Medical History:  Diagnosis Date  . Diabetes mellitus without complication (HCC)   . Hypertension     Encounter Details: CNP Questionnaire - 09/30/17 1130      Questionnaire   Patient Status  Not Applicable    Race  Black or African American    Location [facility] Served At  [facility]    Insurance  Not Applicable    Uninsured  Uninsured (Subsequent visits/quarter)    Food  Yes, have food insecurities    Housing/Utilities  No permanent housing    Transportation  Yes, need transportation assistance    Interpersonal Safety  No, do not feel physically and emotionally safe where you currently live    Medication  Yes, have medication insecurities    Medical Provider  Yes    Referrals  Not Applicable    ED Visit Averted  Yes    Life-Saving Intervention Made  Not Applicable      BP/BG check. Client states he took his medication this morning but did not take his insulin last night. BG is 341. CN discussed importance of taking insulin along with other agents. Client states he will take his insulin tonight and tomorrow and follow up in clinic on Friday AM. CN [facility] discuss next step for client.

## 2017-10-05 ENCOUNTER — Encounter: Payer: Self-pay | Admitting: Pediatric Intensive Care

## 2017-10-05 DIAGNOSIS — E118 Type 2 diabetes mellitus with unspecified complications: Secondary | ICD-10-CM

## 2017-10-05 LAB — GLUCOSE, POCT (MANUAL RESULT ENTRY): POC GLUCOSE: 354 mg/dL — AB (ref 70–99)

## 2017-10-08 NOTE — Congregational Nurse Program (Signed)
Congregational Nurse Program Note  Date of Encounter: 09/18/2017  Past Medical History: Past Medical History:  Diagnosis Date  . Diabetes mellitus without complication (HCC)   . Hypertension     Encounter Details: CNP Questionnaire - 09/30/17 1130      Questionnaire   Patient Status  Not Applicable    Race  Black or African American    Location [facility] S[facility]  [facility]    Insurance  Not Applicable    Uninsured  Uninsured (Subsequent visits/quarter)    Food  Yes, have food insecurities    Housing/Utilities  No permanent housing    Transportation  Yes, need transportation assistance    Interpersonal Safety  No, do not feel physically and emotionally safe where you currently live    Medication  Yes, have medication insecurities    Medical Provider  Yes    Referrals  Not Applicable    ED Visit Averted  Yes    Life-Saving Intervention Made  Not Applicable      New client[place] [facility]. Client states [facility] [employer] [facility]. Client will need PCP- [facility]. He has prescriptions for gabapentin and insulin. CN will fill gabapentin via [facility]. Client will need HepC treatment at some point but will need to determine housing disposition. CN [facility].

## 2017-10-28 NOTE — Congregational Nurse Program (Signed)
Congregational Nurse Program Note  Date of Encounter: 09/29/2017  Past Medical History: Past Medical History:  Diagnosis Date  . Diabetes mellitus without complication (HCC)   . Hypertension     Encounter Details: CNP Questionnaire - 09/30/17 1130      Questionnaire   Patient Status  Not Applicable    Race  Black or African American    Location [facility] Served At  [facility]    Insurance  Not Applicable    Uninsured  Uninsured (Subsequent visits/quarter)    Food  Yes, have food insecurities    Housing/Utilities  No permanent housing    Transportation  Yes, need transportation assistance    Interpersonal Safety  No, do not feel physically and emotionally safe where you currently live    Medication  Yes, have medication insecurities    Medical Provider  Yes    Referrals  Not Applicable    ED Visit Averted  Yes    Life-Saving Intervention Made  Not Applicable      BP check[facility]

## 2017-10-30 NOTE — Congregational Nurse Program (Signed)
Congregational Nurse Program Note  Date of Encounter: 10/05/2017  Past Medical History: Past Medical History:  Diagnosis Date  . Diabetes mellitus without complication (HCC)   . Hypertension     Encounter Details: CNP Questionnaire - 10/05/17 1230      Questionnaire   Patient Status  Not Applicable    Race  Black or African American    Location [facility] Served [facility]    Insurance  Not Applicable    Uninsured  Uninsured (Subsequent visits/quarter)    Food  Yes, have food insecurities    Housing/Utilities  No permanent housing    Transportation  Yes, need transportation assistance    Interpersonal Safety  No, do not feel physically and emotionally safe where you currently live    Medication  Yes, have medication insecurities    Medical Provider  Yes    Referrals  Primary Care Provider/Clinic;Area Agency    ED Visit Averted  Not Applicable    Life-Saving Intervention Made  Not Applicable      BP/BG check. Client states that [facility] He is waiting for transportation to [place].

## 2018-01-01 ENCOUNTER — Encounter: Payer: Self-pay | Admitting: Pediatric Intensive Care

## 2018-01-01 DIAGNOSIS — E118 Type 2 diabetes mellitus with unspecified complications: Secondary | ICD-10-CM

## 2018-01-01 DIAGNOSIS — Z794 Long term (current) use of insulin: Principal | ICD-10-CM

## 2018-01-01 LAB — GLUCOSE, POCT (MANUAL RESULT ENTRY): POC GLUCOSE: 404 mg/dL — AB (ref 70–99)

## 2018-01-01 NOTE — Congregational Nurse Program (Signed)
Congregational Nurse Program Note  Date of Encounter: 01/01/2018  Past Medical History: Past Medical History:  Diagnosis Date  . Diabetes mellitus without complication (HCC)   . Hypertension     Encounter Details: CNP Questionnaire - 01/01/18 0945      Questionnaire   Patient Status  Not Applicable    Race  Black or African American    Location [facility] Served At  [facility]    Insurance  Not Applicable    Uninsured  Uninsured (NEW 1x/quarter)    Food  Yes, have food insecurities    Housing/Utilities  No permanent housing    Transportation  Yes, need transportation assistance    Interpersonal Safety  No, do not feel physically and emotionally safe where you currently live    Medication  No medication insecurities    Medical Provider  Yes    Referrals  Area Agency;Primary Care Provider/Clinic;[facility]s    ED Visit Averted  Yes    Life-Saving Intervention Made  Not Applicable      [facility]. [facility] for ongoing issues with hypertension and diabetes. Here for BP/BG check this AM. States that he has been working with Dor[facility]an [facility] and other case management needs. Client endorses continued memory issues and realizes that he needs to keep careful notes regarding appointments and medication. He did not take his medication this morning. CN reviewed medications with client and suggested pill organizer. CN will contact [facility] regarding ongoing case management needs. Client gives permission to contact Kara MeadM Placey FNP regarding any ongoing medical needs. CN advised checking BG each morning. Also advised going to ED should he start to feel sick (client states he has diarrhea this morning but this is an ongoing issue) or if his blood glucose increases over the weekend. CN provided compression socks and will provide pill organizer/calendar for client. Client agrees to plan.

## 2018-08-02 ENCOUNTER — Encounter: Payer: Self-pay | Admitting: Critical Care Medicine

## 2018-08-19 NOTE — Congregational Nurse Program (Signed)
Client came to [facility] to talk with staff about treatment.  A plan was developed for client to start treatment tomorrow.  Bus passes provided for client to return to [facility]

## 2018-08-19 NOTE — Congregational Nurse Program (Signed)
Client requesting assistance with substance abuse treatment.  Referred client to [facility] and [facility].  Bus passes provided

## 2019-10-28 ENCOUNTER — Ambulatory Visit (HOSPITAL_COMMUNITY)
Admission: RE | Admit: 2019-10-28 | Discharge: 2019-10-28 | Disposition: A | Discharge status: Home / Self Care | Payer: Self-pay | Attending: Psychiatry | Admitting: Psychiatry

## 2019-10-28 DIAGNOSIS — F329 Major depressive disorder, single episode, unspecified: Secondary | ICD-10-CM | POA: Insufficient documentation

## 2019-10-28 DIAGNOSIS — I10 Essential (primary) hypertension: Secondary | ICD-10-CM | POA: Insufficient documentation

## 2019-10-28 DIAGNOSIS — Z7289 Other problems related to lifestyle: Secondary | ICD-10-CM | POA: Insufficient documentation

## 2019-10-28 DIAGNOSIS — F17210 Nicotine dependence, cigarettes, uncomplicated: Secondary | ICD-10-CM | POA: Insufficient documentation

## 2019-10-28 DIAGNOSIS — E119 Type 2 diabetes mellitus without complications: Secondary | ICD-10-CM | POA: Insufficient documentation

## 2019-10-28 DIAGNOSIS — F1490 Cocaine use, unspecified, uncomplicated: Secondary | ICD-10-CM | POA: Insufficient documentation

## 2019-10-28 NOTE — H&P (Signed)
Behavioral Health Medical Screening Exam  Joel Hamilton is an 64 y.o. male patient presented to [facility] as a walk in requesting assistance with alcohol detox.  Patient denies suicidal/self-injury/homicidal ideation, psychosis, and paranoia.  Patient also denies history of seizures and DT's.  Patient states that he is homeless.  Discussed [employer] and [employer].  Patient states that he has a picture ID and is able to work.  Patient referred to [facility] who can assist with transport to [facility].  Patient psychiatric cleared.    Total Time spent with patient: 30 minutes  Psychiatric Specialty Exam: Physical Exam  Vitals reviewed. Constitutional: He is oriented to person, place, and time. He appears well-developed and well-nourished. No distress.  Respiratory: Effort normal.  Musculoskeletal:        General: Normal range of motion.     Cervical back: Normal range of motion.  Neurological: He is alert and oriented to person, place, and time.  Skin: Skin is warm and dry.  Psychiatric: His speech is normal and behavior is normal. Judgment and thought content normal. Cognition and memory are normal. He exhibits a depressed mood (stable).    Review of Systems  Psychiatric/Behavioral: Negative for confusion, hallucinations, self-injury, sleep disturbance and suicidal ideas. Nervous/anxious: Stable.        History of depression. Alcohol abuse   All other systems reviewed and are negative.   Blood pressure 102/76, pulse 98, temperature 98 F (36.7 C), temperature source Oral, resp. rate 20, SpO2 100 %.There is no height or weight on file to calculate BMI.  General Appearance: Casual  Eye Contact:  Good  Speech:  Clear and Coherent and Normal Rate  Volume:  Normal  Mood:  "little depressed, just want to get help for my alcohol"  Affect:  NA and Congruent  Thought Process:  Coherent, Goal Directed and Descriptions of Associations: Intact  Orientation:  Full (Time, Place,  and Person)  Thought Content:  WDL  Suicidal Thoughts:  No  Homicidal Thoughts:  No  Memory:  Immediate;   Good Recent;   Good  Judgement:  Intact  Insight:  Present  Psychomotor Activity:  Normal  Concentration: Concentration: Good and Attention Span: Good  Recall:  Good  Fund of Knowledge:Fair  Language: Good  Akathisia:  No  Handed:  Right  AIMS (if indicated):     Assets:  Communication Skills Desire for Improvement Financial Resources/Insurance Social Support  Sleep:       Musculoskeletal: Strength & Muscle Tone: within normal limits Gait & Station: normal Patient leans: N/A  Blood pressure 102/76, pulse 98, temperature 98 F (36.7 C), temperature source Oral, resp. rate 20, SpO2 100 %.  Recommendations:  Outpatient psychiatric services, Ref[facility] to [facility] and [facility]  Based on my evaluation the patient does not appear to have an emergency medical condition.   Disposition:  Patient psychiatrically cleared No evidence of imminent risk to self or others at present.   Patient does not meet criteria for psychiatric inpatient admission. Supportive therapy provided about ongoing stressors. Refer to IOP. Discussed crisis plan, support from social network, calling 911, coming to the Emergency Department, and calling Suicide Hotline.     Other Atienza, NP 10/28/2019, 3:23 PM

## 2019-10-28 NOTE — BH Assessment (Signed)
Assessment Note  Joel Hamilton is an 64 y.o. male. Patient presents to [facility] as a walk-in; self referral. Patient seeking detox from crack cocaine and alcohol. States that he is homeless and binge drinks daily. He drinks at minimum 6-7 beers (24) ounce per day. Last drink was the day before yesterday. He also uses crack cocaine daily and last use was 2 weeks ago. He participated in substance abuse treatment at [facility] at the end of 2020. He also reports participating in other substance use programs but does not recall the name of the facilities or dates of treatment. He reports agitation as his only withdrawal symptom. No history of seizures. He denies SI. No prior history of suicide attempts. Stressor reported is his homelessness. He reports depressive symptoms: isolating self from others and loss of pleasure in his usual activities.  No HI. No history of aggressive behaviors. No history of AVH's. Patient receives outpatient services at [facility]. Last appointment at the facility was 1 year ago. He is not prescribed any psychotropics. Patient was calm and cooperative during the assessment. Mood and affect were appropriate. Patient was oriented to time, person, place, and situation   Diagnosis: Depressive Disorder and Substance Use Disorder (Cocaine and Alcohol Use Disorder)  Past Medical History:  Past Medical History:  Diagnosis Date  . Diabetes mellitus without complication (HCC)   . Hypertension     No past surgical history on file.  Family History: No family history on file.  Social History:  reports that he has been smoking cigarettes. He has never used smokeless tobacco. He reports current alcohol use. No history on file for drug.  Additional Social History:  Alcohol / Drug Use Pain Medications: SEE MAR Prescriptions: SEE MAR Over the Counter: SEE MAR History of alcohol / drug use?: Yes Withdrawal Symptoms: Agitation Substance #1 Name of Substance 1: crack cocaine 1  - Age of First Use: 64 yrs old 1 - Amount (size/oz): "couple $100's per use" 1 - Frequency: daily 1 - Duration: on-going for several years 1 - Last Use / Amount: "2 weeks ago" Substance #2 Name of Substance 2: Alcohol 2 - Age of First Use: 64 yrs old 2 - Amount (size/oz): "binges" (mostly beer) 2 - Frequency: daily 2 - Duration: on-going for several years 2 - Last Use / Amount: 10/26/2019  CIWA: CIWA-Ar BP: 102/76 Pulse Rate: 98 COWS:    Allergies: No Known Allergies  Home Medications: (Not in a hospital admission)   OB/GYN Status:  No LMP for male patient.  General Assessment Data Location of Assessment: [facility] Assessment: In system Is this a Tele or Face-to-Face Assessment?: Face-to-Face Is this an Initial Assessment or a Re-assessment for this encounter?: Initial Assessment Patient Accompanied by:: (self referral) Language Other than English: (n/a) Living Arrangements: Other (Comment) What gender do you identify as?: Male Marital status: Other (comment) Maiden name: (n/a) Pregnancy Status: (n/a) Living Arrangements: Other (Comment)(homeless) Can pt return to current living arrangement?: Yes Admission Status: Voluntary Is patient capable of signing voluntary admission?: Yes Referral Source: Self/Family/Friend Insurance type: (Self Pay )     Crisis Care Plan Living Arrangements: Other (Comment)(homeless) Legal Guardian: (no legal guardian ) Name of Psychiatrist: ([facility] Name of Therapist: ([facility])  Education Status Is patient currently in school?: No Is the patient employed, unemployed or receiving disability?: Unemployed  Risk to self with the past 6 months Suicidal Ideation: No Has patient been a  risk to self within the past 6 months prior to admission? : No Suicidal Intent: No Has patient had any suicidal intent within the past 6 months prior to admission? : No Is patient at risk for  suicide?: No Suicidal Plan?: No Has patient had any suicidal plan within the past 6 months prior to admission? : No Access to Means: No What has been your use of drugs/alcohol within the last 12 months?: (cocaine and alcohol ) Previous Attempts/Gestures: No How many times?: (0) Other Self Harm Risks: (denies ) Triggers for Past Attempts: Other (Comment)(no prior attempts and/or gestures ) Intentional Self Injurious Behavior: None Family Suicide History: No Recent stressful life event(s): Other (Comment), Financial Problems(homeless) Persecutory voices/beliefs?: No Depression: No Depression Symptoms: Loss of interest in usual pleasures, Fatigue, Isolating Substance abuse history and/or treatment for substance abuse?: No Suicide prevention information given to non-admitted patients: Not applicable  Risk to Others within the past 6 months Homicidal Ideation: No Does patient have any lifetime risk of violence toward others beyond the six months prior to admission? : No Thoughts of Harm to Others: No Current Homicidal Intent: No Current Homicidal Plan: No Access to Homicidal Means: No Identified Victim: (n/a) History of harm to others?: No Assessment of Violence: None Noted Violent Behavior Description: (patient is calm and cooperative ) Does patient have access to weapons?: No Criminal Charges Pending?: No Does patient have a court date: No Is patient on probation?: No  Psychosis Hallucinations: None noted Delusions: None noted  Mental Status Report Appearance/Hygiene: Disheveled Eye Contact: Good Motor Activity: Freedom of movement Speech: Logical/coherent Level of Consciousness: Alert Mood: Depressed Affect: Appropriate to circumstance Anxiety Level: None Thought Processes: Coherent, Relevant Judgement: Impaired Orientation: Person, Place, Time, Situation Obsessive Compulsive Thoughts/Behaviors: None  Cognitive Functioning Concentration: Decreased Memory: Recent  Intact, Remote Intact Is patient IDD: No Insight: Good Impulse Control: Good Appetite: Good Have you had any weight changes? : No Change Sleep: No Change Total Hours of Sleep: (6 to 8 hrs ) Vegetative Symptoms: None  ADLScreening [facility]) Patient's cognitive ability adequate to safely complete daily activities?: Yes Patient able to express need for assistance with ADLs?: Yes Independently performs ADLs?: Yes (appropriate for developmental age)  Prior Inpatient Therapy Prior Inpatient Therapy: No  Prior Outpatient Therapy Prior Outpatient Therapy: No Does patient have an ACCT team?: No Does patient have Intensive In-House Services?  : No Does patient have [facility] services? : No Does patient have P4CC services?: No  ADL Screening (condition at time of admission) Patient's cognitive ability adequate to safely complete daily activities?: Yes Is the patient deaf or have difficulty hearing?: No Does the patient have difficulty seeing, even when wearing glasses/contacts?: No Does the patient have difficulty concentrating, remembering, or making decisions?: No Patient able to express need for assistance with ADLs?: Yes Does the patient have difficulty dressing or bathing?: No Independently performs ADLs?: Yes (appropriate for developmental age) Does the patient have difficulty walking or climbing stairs?: No Weakness of Legs: None Weakness of Arms/Hands: None     Therapy Consults (therapy consults require a physician order) PT Evaluation Needed: No OT Evalulation Needed: No SLP Evaluation Needed: No Abuse/Neglect Assessment (Assessment to be complete while patient is alone) Abuse/Neglect Assessment Can Be Completed: Yes Physical Abuse: Denies Verbal Abuse: Denies Sexual Abuse: Denies Exploitation of patient/patient's resources: Denies Self-Neglect: Denies Values / Beliefs Cultural Requests During Hospitalization: None Spiritual Requests During  Hospitalization: None Consults Spiritual Care Consult Needed: No Transition of Care Team Consult  Needed: No Advance Directives (For Healthcare) Does Patient Have a Medical Advance Directive?: No Nutrition Screen- [facility] Adult/[facility]/AP Patient's home diet: Regular Has the patient been eating poorly because of a decreased appetite?: No        Disposition: Patient does not meet criteria for inpatient treatment at [facility], per [facility] [place], NP. Patient given referrals for follow treatment at facilities that provide substance abuse detox/residential. Patient referred to [facility], [facility], [facility], [facility], etc.  Disposition Initial Assessment Completed for this Encounter: Yes Disposition of Patient: Discharge Patient referred to: Outpatient clinic referral, [facility], Other (Comment)(Patient discharged with outpatient referrals)  On Site Evaluation by:   Reviewed with Physician:    Waldon Merl 10/28/2019 3:56 PM

## 2019-11-11 ENCOUNTER — Other Ambulatory Visit: Payer: Self-pay

## 2019-11-11 DIAGNOSIS — Z20822 Contact with and (suspected) exposure to covid-19: Secondary | ICD-10-CM

## 2019-11-12 LAB — NOVEL CORONAVIRUS, NAA: SARS-CoV-2, NAA: NOT DETECTED

## 2019-12-26 ENCOUNTER — Other Ambulatory Visit: Payer: Self-pay

## 2019-12-26 ENCOUNTER — Encounter (HOSPITAL_COMMUNITY): Payer: Self-pay | Admitting: Student

## 2019-12-26 ENCOUNTER — Emergency Department (HOSPITAL_COMMUNITY)
Admission: EM | Admit: 2019-12-26 | Discharge: 2019-12-26 | Disposition: A | Discharge status: Home / Self Care | Payer: Self-pay | Attending: Emergency Medicine | Admitting: Emergency Medicine

## 2019-12-26 DIAGNOSIS — F1010 Alcohol abuse, uncomplicated: Secondary | ICD-10-CM | POA: Insufficient documentation

## 2019-12-26 DIAGNOSIS — E119 Type 2 diabetes mellitus without complications: Secondary | ICD-10-CM | POA: Insufficient documentation

## 2019-12-26 DIAGNOSIS — I10 Essential (primary) hypertension: Secondary | ICD-10-CM | POA: Insufficient documentation

## 2019-12-26 DIAGNOSIS — Z0489 Encounter for examination and observation for other specified reasons: Secondary | ICD-10-CM | POA: Insufficient documentation

## 2019-12-26 DIAGNOSIS — Z5900 Homelessness unspecified: Secondary | ICD-10-CM

## 2019-12-26 DIAGNOSIS — F17210 Nicotine dependence, cigarettes, uncomplicated: Secondary | ICD-10-CM | POA: Insufficient documentation

## 2019-12-26 DIAGNOSIS — Z79899 Other long term (current) drug therapy: Secondary | ICD-10-CM | POA: Insufficient documentation

## 2019-12-26 LAB — COMPREHENSIVE METABOLIC PANEL
ALT: 103 U/L — ABNORMAL HIGH (ref 0–44)
AST: 96 U/L — ABNORMAL HIGH (ref 15–41)
Albumin: 2.9 g/dL — ABNORMAL LOW (ref 3.5–5.0)
Alkaline Phosphatase: 93 U/L (ref 38–126)
Anion gap: 10 (ref 5–15)
BUN: 50 mg/dL — ABNORMAL HIGH (ref 8–23)
CO2: 22 mmol/L (ref 22–32)
Calcium: 8.4 mg/dL — ABNORMAL LOW (ref 8.9–10.3)
Chloride: 105 mmol/L (ref 98–111)
Creatinine, Ser: 2.17 mg/dL — ABNORMAL HIGH (ref 0.61–1.24)
GFR calc Af Amer: 36 mL/min — ABNORMAL LOW (ref >60)
GFR calc non Af Amer: 31 mL/min — ABNORMAL LOW (ref >60)
Glucose, Bld: 219 mg/dL — ABNORMAL HIGH (ref 70–99)
Potassium: 4.4 mmol/L (ref 3.5–5.1)
Sodium: 137 mmol/L (ref 135–145)
Total Bilirubin: 0.4 mg/dL (ref 0.3–1.2)
Total Protein: 7.7 g/dL (ref 6.5–8.1)

## 2019-12-26 LAB — CBC
HCT: 52 % (ref 39.0–52.0)
Hemoglobin: 16.8 g/dL (ref 13.0–17.0)
MCH: 30.1 pg (ref 26.0–34.0)
MCHC: 32.3 g/dL (ref 30.0–36.0)
MCV: 93 fL (ref 80.0–100.0)
Platelets: 288 10*3/uL (ref 150–400)
RBC: 5.59 MIL/uL (ref 4.22–5.81)
RDW: 13.6 % (ref 11.5–15.5)
WBC: 14 10*3/uL — ABNORMAL HIGH (ref 4.0–10.5)
nRBC: 0 % (ref 0.0–0.2)

## 2019-12-26 LAB — ETHANOL: Alcohol, Ethyl (B): <10 mg/dL (ref <10)

## 2019-12-26 NOTE — Discharge Instructions (Signed)
Follow-up with the outpatient alcohol treatment resources as provided in this discharge summary.

## 2019-12-26 NOTE — ED Provider Notes (Signed)
[facility]   CSN: [id number] Arrival date & time: 12/26/19  1724     History Chief Complaint  Patient presents with  . Drug / Alcohol Assessment    Joel Hamilton is a 64 y.o. male.  Patient is a 64 year old male with past medical history of diabetes, hypertension, chronic alcoholism, and homelessness.  Patient presents today requesting treatment with alcohol.  Patient tells me he has been in rehab facilities in the past, mainly in [place].  This is where the patient is from.  When I asked him how he ended up here, he tells me he was "with the wrong people", but refuses to elaborate.  He denies any suicidal or homicidal ideation.  He denies any other complaints.  The history is provided by the patient.       Past Medical History:  Diagnosis Date  . Diabetes mellitus without complication (HCC)   . Hypertension     Patient Active Problem List   Diagnosis Date Noted  . Substance induced mood disorder (HCC) 09/23/2017    History reviewed. No pertinent surgical history.     History reviewed. No pertinent family history.  Social History   Tobacco Use  . Smoking status: Current Every Day Smoker    Types: Cigarettes  . Smokeless tobacco: Never Used  Substance Use Topics  . Alcohol use: Yes  . Drug use: Not on file    Home Medications Prior to Admission medications   Medication Sig Start Date End Date Taking? Authorizing Provider  gabapentin (NEURONTIN) 600 MG tablet Take 600 mg by mouth 3 (three) times daily.    [provider]    Allergies    Lisinopril  Review of Systems   Review of Systems  All other systems reviewed and are negative.   Physical Exam Updated Vital Signs BP (!) 159/92   Pulse 97   Temp 98.8 F (37.1 C) (Oral)   Resp 15   SpO2 99%   Physical Exam Vitals and nursing note reviewed.  Constitutional:      General: He is not in acute distress.    Appearance: He is  well-developed. He is not diaphoretic.     Comments: Patient is somewhat disheveled in appearance.  HENT:     Head: Normocephalic and atraumatic.  Cardiovascular:     Rate and Rhythm: Normal rate and regular rhythm.     Heart sounds: No murmur. No friction rub.  Pulmonary:     Effort: Pulmonary effort is normal. No respiratory distress.     Breath sounds: Normal breath sounds. No wheezing or rales.  Abdominal:     General: Bowel sounds are normal. There is no distension.     Palpations: Abdomen is soft.     Tenderness: There is no abdominal tenderness.  Musculoskeletal:        General: Normal range of motion.     Cervical back: Normal range of motion and neck supple.  Skin:    General: Skin is warm and dry.  Neurological:     Mental Status: He is alert and oriented to person, place, and time.     Coordination: Coordination normal.  Psychiatric:        Attention and Perception: He is inattentive.        Speech: Speech normal.        Behavior: Behavior is slowed.        Thought Content: Thought content does not include homicidal or suicidal ideation.  ED Results / Procedures / Treatments   Labs (all labs ordered are listed, but only abnormal results are displayed) Labs Reviewed  COMPREHENSIVE METABOLIC PANEL - Abnormal; Notable for the following components:      Result Value   Glucose, Bld 219 (*)    BUN 50 (*)    Creatinine, Ser 2.17 (*)    Calcium 8.4 (*)    Albumin 2.9 (*)    AST 96 (*)    ALT 103 (*)    GFR calc non Af Amer 31 (*)    GFR calc Af Amer 36 (*)    All other components within normal limits  ETHANOL  CBC  RAPID URINE DRUG SCREEN, HOSP PERFORMED    EKG None  Radiology No results found.  Procedures Procedures (including critical care time)  Medications Ordered in ED Medications - No data to display  ED Course  I have reviewed the triage vital signs and the nursing notes.  Pertinent labs & imaging results that were available during my  care of the patient were reviewed by me and considered in my medical decision making (see chart for details).    MDM Rules/Calculators/A&P  Patient presents here requesting alcohol detox.  Patient's laboratory studies show a mild bump of his creatinine above his baseline, but are otherwise unremarkable.  His blood alcohol is less than 10.  Patient does not appear intoxicated or acutely withdrawing.  I feel as though outpatient alcohol treatment is appropriate.  Patient will be given the resource guide for local treatment facilities.  Final Clinical Impression(s) / ED Diagnoses Final diagnoses:  None    Rx / DC Orders ED Discharge Orders    None       Veryl Speak, MD 12/26/19 [phone]

## 2019-12-26 NOTE — ED Triage Notes (Signed)
Patient arrived via [facility] [place] fire [place] and asked for help.   Patient lives in [place] and was hanging out with a buddy.   Sounds like a 4 day binge of cocaine and alcohol.   Last consumption was this morning.   Patient requesting detox.   BP-170 pal with ems  HX. Hypertension (HTCZ and lisinopril), DM on insulin and metformin.   CBG-231  A/Ox4 Ambulatory with ems

## 2019-12-26 NOTE — Progress Notes (Signed)
TTC aware patient needs resources for homeless shelter.  Awaiting for discharge.

## 2019-12-26 NOTE — Progress Notes (Signed)
Consult request has been received. CSW attempting to follow up at present time.  CSW notes that per chart review pt is requesting resources for alcohol/substance use tx, preferably in [place], [place].  CSW will provide pt with transportation that will be able to be utilized in the morning to assist the pt to go to Outpatient IOP at [facility] where pt was connected to in the past as well as to [facility] in [place], [place] for residential inpatient use or to, if pt prefers, the [facility] in [place], [place] where pt can seek info on the next cycle that is accepting clients for inpatient residential substance use tx.  CSW will continue to follow for D/C needs.  Dorothe Pea. Vera Wishart  MSW, LCSW, LCAS, CSI Transitions of Care Clinical Social Worker Care Coordination Department Ph: [phone]
